# Patient Record
Sex: Female | Born: 1955 | Race: White | Hispanic: No | Marital: Married | Smoking: Former smoker
Health system: Southern US, Community
[De-identification: ages and names within clinical notes are randomized; demographics above are authoritative.]

## PROBLEM LIST (undated history)

## (undated) DIAGNOSIS — I10 Essential (primary) hypertension: Secondary | ICD-10-CM

## (undated) DIAGNOSIS — E7800 Pure hypercholesterolemia, unspecified: Secondary | ICD-10-CM

## (undated) DIAGNOSIS — F32A Depression, unspecified: Secondary | ICD-10-CM

## (undated) DIAGNOSIS — E119 Type 2 diabetes mellitus without complications: Secondary | ICD-10-CM

---

## 2017-02-13 ENCOUNTER — Other Ambulatory Visit (HOSPITAL_COMMUNITY)
Admission: RE | Admit: 2017-02-13 | Discharge: 2017-02-13 | Disposition: A | Discharge status: Home / Self Care | Payer: Self-pay | Source: Ambulatory Visit | Attending: Family Medicine | Admitting: Family Medicine

## 2017-02-13 ENCOUNTER — Other Ambulatory Visit: Payer: Self-pay | Admitting: Family Medicine

## 2017-02-13 DIAGNOSIS — Z124 Encounter for screening for malignant neoplasm of cervix: Secondary | ICD-10-CM | POA: Insufficient documentation

## 2017-02-15 LAB — CYTOLOGY - PAP: Diagnosis: NEGATIVE

## 2017-04-12 ENCOUNTER — Other Ambulatory Visit: Payer: Self-pay | Admitting: Family Medicine

## 2017-04-12 DIAGNOSIS — Z1231 Encounter for screening mammogram for malignant neoplasm of breast: Secondary | ICD-10-CM

## 2017-04-30 ENCOUNTER — Ambulatory Visit
Admission: RE | Admit: 2017-04-30 | Discharge: 2017-04-30 | Disposition: A | Discharge status: Home / Self Care | Payer: BLUE CROSS/BLUE SHIELD | Source: Ambulatory Visit | Attending: Family Medicine | Admitting: Family Medicine

## 2017-04-30 ENCOUNTER — Encounter (INDEPENDENT_AMBULATORY_CARE_PROVIDER_SITE_OTHER): Payer: Self-pay

## 2017-04-30 DIAGNOSIS — Z1231 Encounter for screening mammogram for malignant neoplasm of breast: Secondary | ICD-10-CM

## 2018-06-05 ENCOUNTER — Other Ambulatory Visit: Payer: Self-pay | Admitting: Family Medicine

## 2018-06-05 DIAGNOSIS — Z1231 Encounter for screening mammogram for malignant neoplasm of breast: Secondary | ICD-10-CM

## 2018-06-26 ENCOUNTER — Ambulatory Visit
Admission: RE | Admit: 2018-06-26 | Discharge: 2018-06-26 | Disposition: A | Discharge status: Home / Self Care | Payer: BLUE CROSS/BLUE SHIELD | Source: Ambulatory Visit | Attending: Family Medicine | Admitting: Family Medicine

## 2018-06-26 DIAGNOSIS — Z1231 Encounter for screening mammogram for malignant neoplasm of breast: Secondary | ICD-10-CM

## 2019-05-28 ENCOUNTER — Other Ambulatory Visit: Payer: Self-pay | Admitting: Family Medicine

## 2019-05-28 DIAGNOSIS — Z1231 Encounter for screening mammogram for malignant neoplasm of breast: Secondary | ICD-10-CM

## 2019-07-09 ENCOUNTER — Other Ambulatory Visit: Payer: Self-pay

## 2019-07-09 ENCOUNTER — Ambulatory Visit
Admission: RE | Admit: 2019-07-09 | Discharge: 2019-07-09 | Disposition: A | Discharge status: Home / Self Care | Payer: Medicare Other | Source: Ambulatory Visit | Attending: Family Medicine | Admitting: Family Medicine

## 2019-07-09 DIAGNOSIS — Z1231 Encounter for screening mammogram for malignant neoplasm of breast: Secondary | ICD-10-CM

## 2019-11-10 ENCOUNTER — Ambulatory Visit: Payer: Medicare Other | Attending: Internal Medicine

## 2019-11-10 ENCOUNTER — Other Ambulatory Visit: Payer: Self-pay

## 2019-11-10 DIAGNOSIS — Z20822 Contact with and (suspected) exposure to covid-19: Secondary | ICD-10-CM

## 2019-11-11 LAB — NOVEL CORONAVIRUS, NAA: SARS-CoV-2, NAA: NOT DETECTED

## 2019-12-25 ENCOUNTER — Ambulatory Visit: Payer: Medicare Other

## 2020-01-02 ENCOUNTER — Ambulatory Visit: Payer: Medicare Other | Attending: Internal Medicine

## 2020-01-02 DIAGNOSIS — Z23 Encounter for immunization: Secondary | ICD-10-CM | POA: Insufficient documentation

## 2020-01-02 NOTE — Progress Notes (Signed)
   Covid-19 Vaccination Clinic  Name:  Vanessa Moore    MRN: [id number] DOB: 23-Feb-1956  01/02/2020  [name] was observed post Covid-19 immunization for 30 minutes based on pre-vaccination screening without incidence. She was provided with Vaccine Information Sheet and instruction to access the V-Safe system.   [name] was instructed to call 911 with any severe reactions post vaccine: Marland Kitchen Difficulty breathing  . Swelling of your face and throat  . A fast heartbeat  . A bad rash all over your body  . Dizziness and weakness    Immunizations Administered    Name Date Dose VIS Date Route   Pfizer COVID-19 Vaccine 01/02/2020  1:15 PM 0.3 mL 11/06/2019 Intramuscular   Manufacturer: [employer], [employer]   Lot: [id number]   NDC: [address]-2

## 2020-01-15 ENCOUNTER — Ambulatory Visit: Payer: Medicare Other

## 2020-01-27 ENCOUNTER — Ambulatory Visit: Payer: Medicare Other | Attending: Internal Medicine

## 2020-01-27 DIAGNOSIS — Z23 Encounter for immunization: Secondary | ICD-10-CM

## 2020-01-27 NOTE — Progress Notes (Signed)
   Covid-19 Vaccination Clinic  Name:  ZISSEL BIEDERMAN    MRN: [id number] DOB: September 25, 1956  01/27/2020  Ms. Vancamp was observed post Covid-19 immunization for 15 minutes without incident. She was provided with Vaccine Information Sheet and instruction to access the V-Safe system.   Ms. Aplin was instructed to call 911 with any severe reactions post vaccine: Marland Kitchen Difficulty breathing  . Swelling of face and throat  . A fast heartbeat  . A bad rash all over body  . Dizziness and weakness   Immunizations Administered    Name Date Dose VIS Date Route   Pfizer COVID-19 Vaccine 01/27/2020 11:02 AM 0.3 mL 11/06/2019 Intramuscular   Manufacturer: [employer], [employer]   Lot: [id number]   NDC: [address]-4

## 2020-05-30 ENCOUNTER — Ambulatory Visit: Payer: Medicare Other | Attending: Internal Medicine

## 2020-05-30 DIAGNOSIS — Z20822 Contact with and (suspected) exposure to covid-19: Secondary | ICD-10-CM

## 2020-05-31 LAB — NOVEL CORONAVIRUS, NAA: SARS-CoV-2, NAA: NOT DETECTED

## 2020-05-31 LAB — SARS-COV-2, NAA 2 DAY TAT

## 2020-09-22 ENCOUNTER — Other Ambulatory Visit: Payer: Self-pay | Admitting: Family Medicine

## 2020-09-22 DIAGNOSIS — E2839 Other primary ovarian failure: Secondary | ICD-10-CM

## 2020-09-22 DIAGNOSIS — Z1231 Encounter for screening mammogram for malignant neoplasm of breast: Secondary | ICD-10-CM

## 2021-01-03 ENCOUNTER — Ambulatory Visit: Payer: Medicare Other

## 2021-01-03 ENCOUNTER — Other Ambulatory Visit: Payer: Medicare Other

## 2021-02-13 ENCOUNTER — Inpatient Hospital Stay: Admission: RE | Admit: 2021-02-13 | Payer: Medicare Other | Source: Ambulatory Visit

## 2021-04-05 ENCOUNTER — Ambulatory Visit
Admission: RE | Admit: 2021-04-05 | Discharge: 2021-04-05 | Disposition: A | Discharge status: Home / Self Care | Payer: Medicare Other | Source: Ambulatory Visit | Attending: Family Medicine | Admitting: Family Medicine

## 2021-04-05 ENCOUNTER — Other Ambulatory Visit: Payer: Self-pay

## 2021-04-05 DIAGNOSIS — Z1231 Encounter for screening mammogram for malignant neoplasm of breast: Secondary | ICD-10-CM

## 2021-05-22 ENCOUNTER — Ambulatory Visit
Admission: RE | Admit: 2021-05-22 | Discharge: 2021-05-22 | Disposition: A | Discharge status: Home / Self Care | Payer: Medicare Other | Source: Ambulatory Visit | Attending: Family Medicine | Admitting: Family Medicine

## 2021-05-22 ENCOUNTER — Other Ambulatory Visit: Payer: Self-pay

## 2021-05-22 DIAGNOSIS — E2839 Other primary ovarian failure: Secondary | ICD-10-CM

## 2021-10-03 DIAGNOSIS — E1169 Type 2 diabetes mellitus with other specified complication: Secondary | ICD-10-CM | POA: Diagnosis not present

## 2022-10-29 ENCOUNTER — Other Ambulatory Visit: Payer: Self-pay | Admitting: Internal Medicine

## 2022-10-29 ENCOUNTER — Ambulatory Visit
Admission: RE | Admit: 2022-10-29 | Discharge: 2022-10-29 | Disposition: A | Discharge status: Home / Self Care | Payer: Medicare Other | Source: Ambulatory Visit | Attending: Internal Medicine | Admitting: Internal Medicine

## 2022-10-29 DIAGNOSIS — Z1231 Encounter for screening mammogram for malignant neoplasm of breast: Secondary | ICD-10-CM

## 2022-11-23 IMAGING — MG MM DIGITAL SCREENING BILAT W/ TOMO AND CAD
8 series · 9 of 24 positions shown · non-contrast
Comparison: Previous exam(s).

CLINICAL DATA: Screening.

EXAM:
DIGITAL SCREENING BILATERAL MAMMOGRAM WITH TOMOSYNTHESIS AND CAD
TECHNIQUE: Bilateral screening digital craniocaudal and mediolateral oblique
mammograms were obtained. Bilateral screening digital breast
tomosynthesis was performed. The images were evaluated with
computer-aided detection.

[L MLO synth-2D]
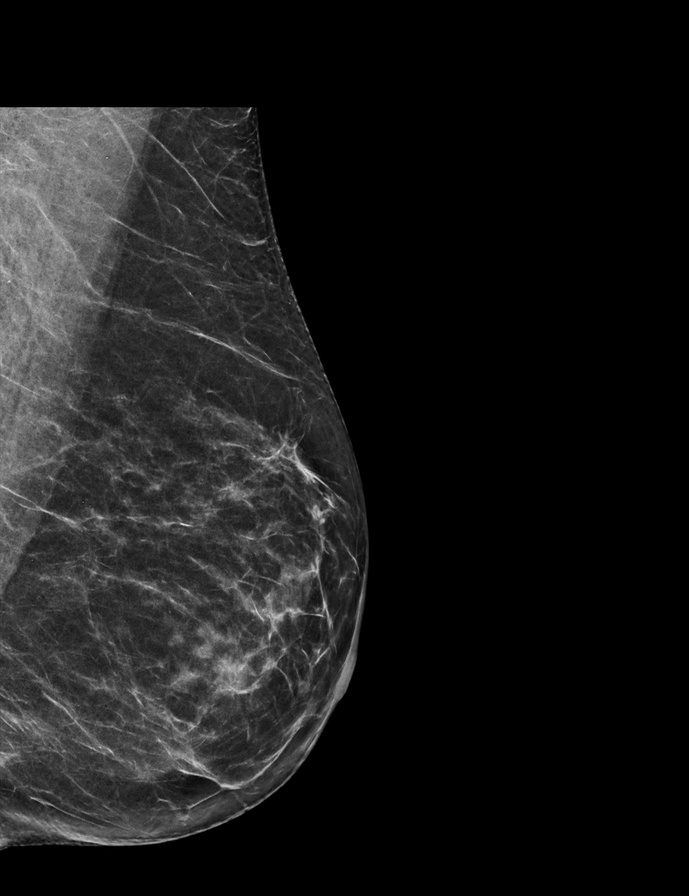

[R CC synth-2D]
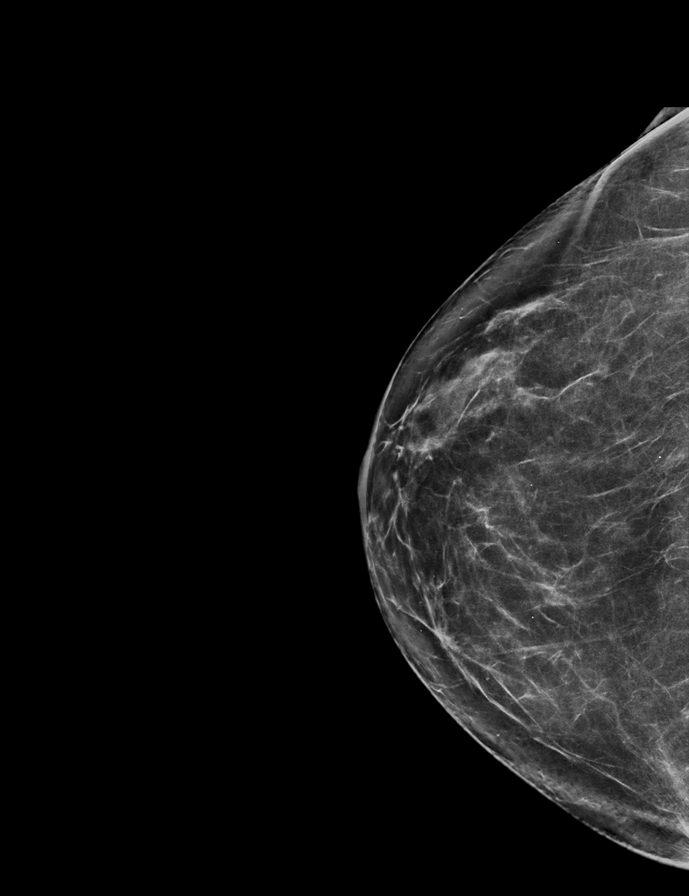

[L CC synth-2D]
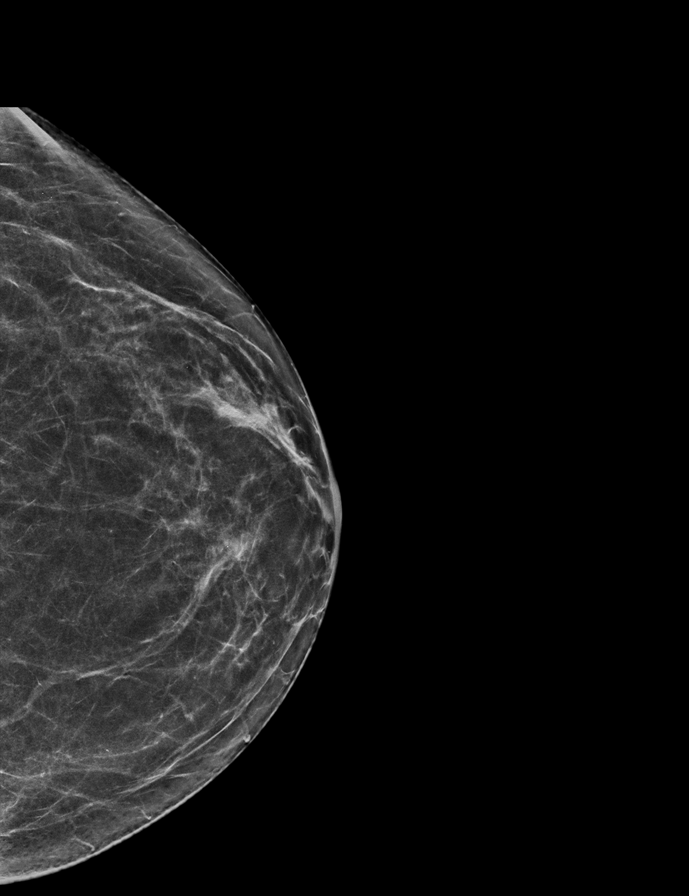

[R MLO synth-2D]
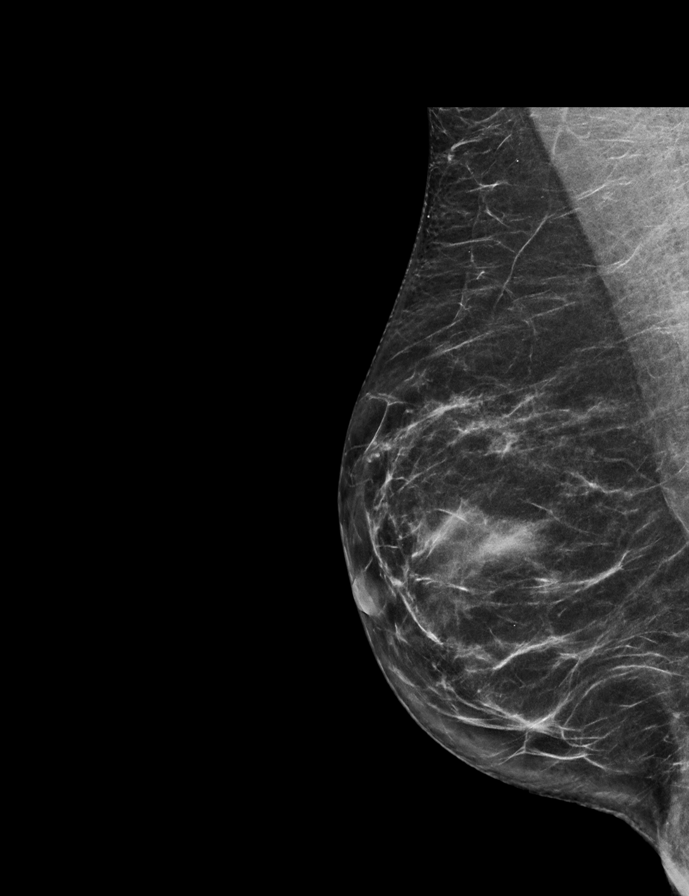

[R CC tomo · 2 of 69 frames shown]
[frame 23/69]
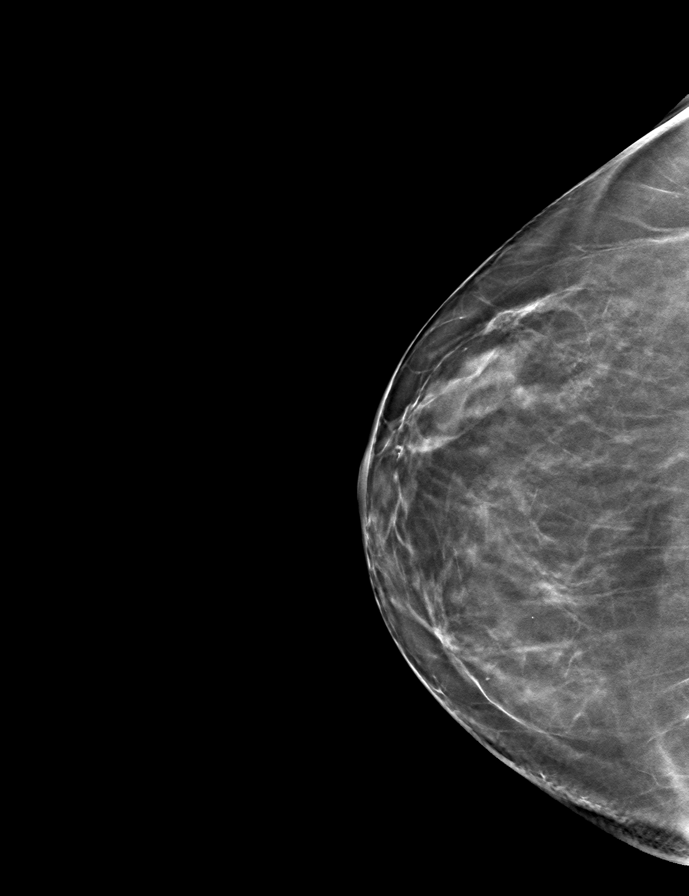
[frame 35/69]
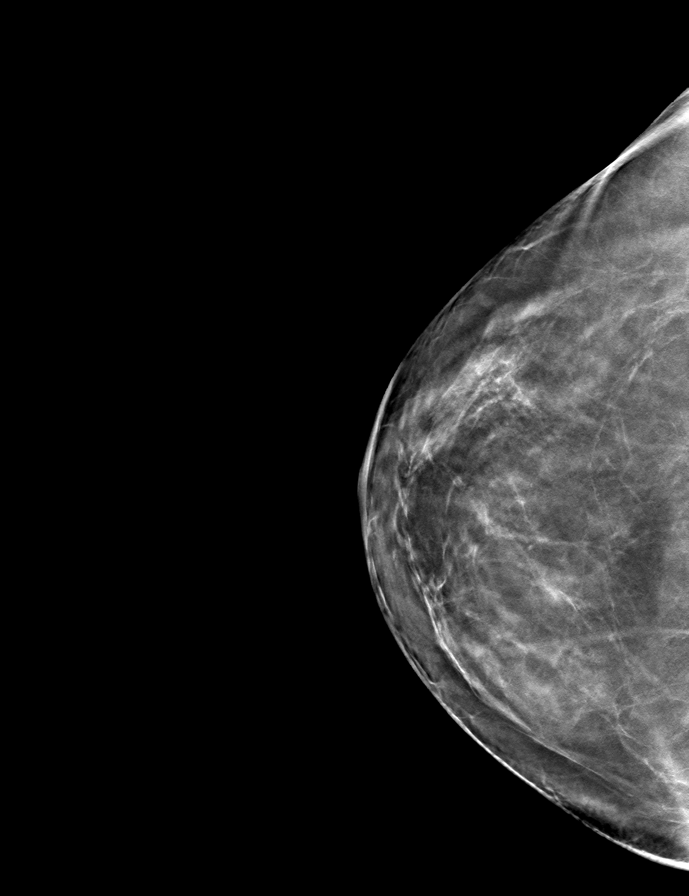

[R MLO tomo · tomo slice 31/61.0]
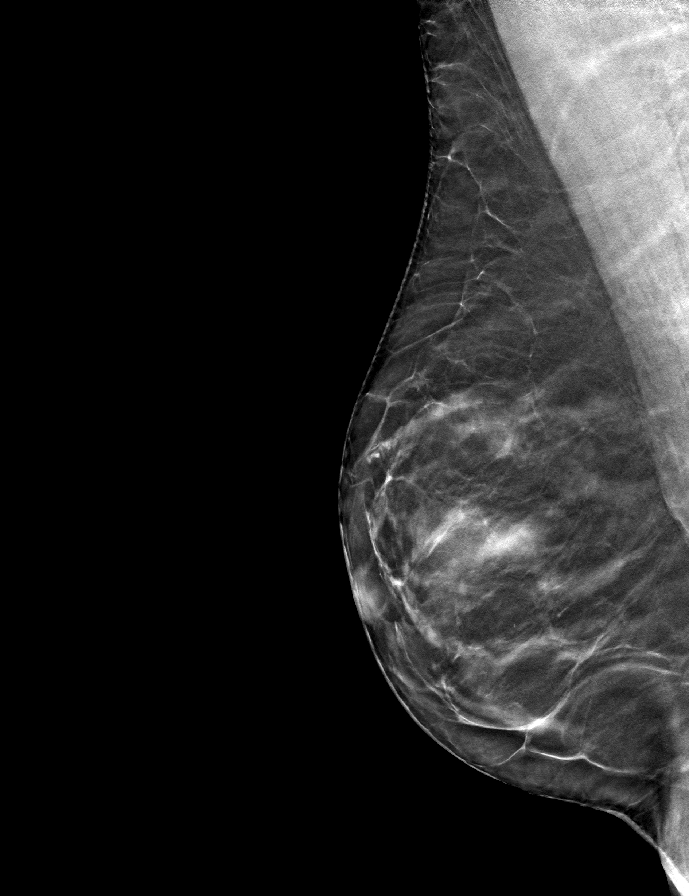

[L CC tomo · tomo slice 29/58.0]
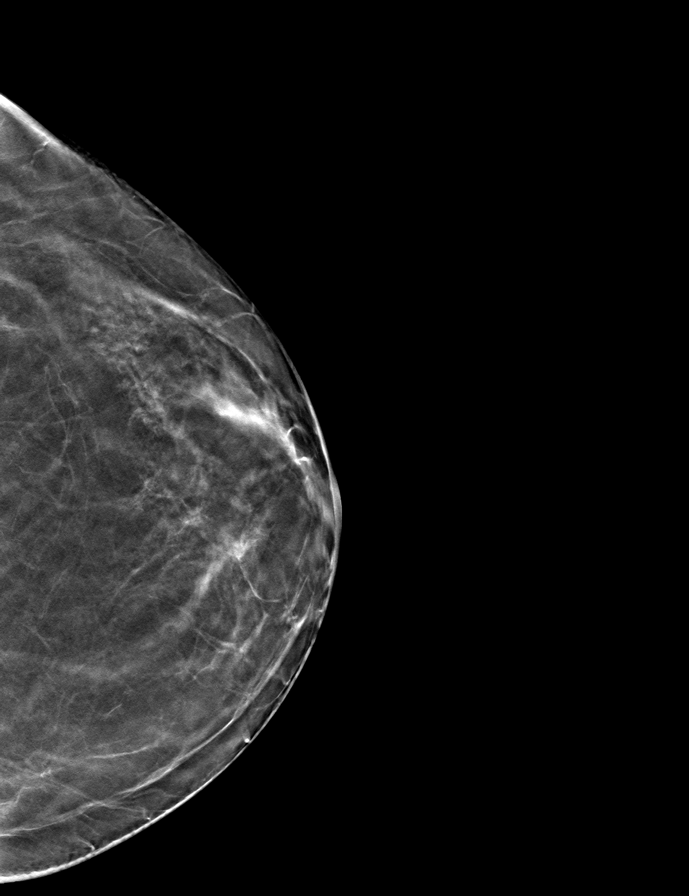

[L MLO tomo · tomo slice 31/60.0]
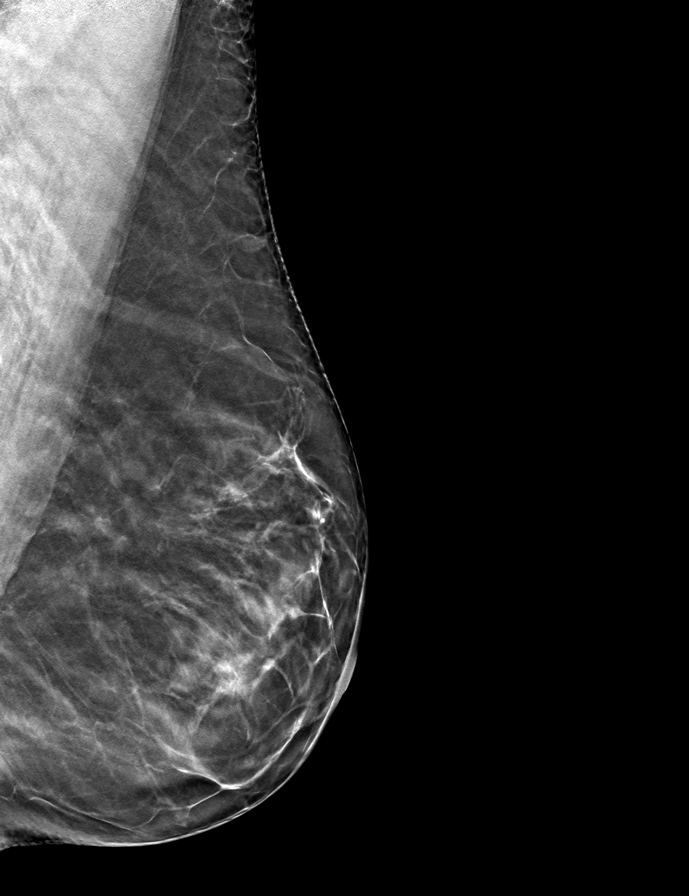

[9 of 24 positions shown; findings below may reference images not displayed]

ACR Breast Density Category b: There are scattered areas of
fibroglandular density.
FINDINGS: There are no findings suspicious for malignancy. The images were
evaluated with computer-aided detection.
IMPRESSION: No mammographic evidence of malignancy. A result letter of this
screening mammogram will be mailed directly to the patient.

RECOMMENDATION:
Screening mammogram in one year. (Code:WJ-[id number])

BI-RADS CATEGORY  1: Negative.

## 2023-05-01 DIAGNOSIS — F39 Unspecified mood [affective] disorder: Secondary | ICD-10-CM | POA: Diagnosis not present

## 2023-05-01 DIAGNOSIS — E7800 Pure hypercholesterolemia, unspecified: Secondary | ICD-10-CM | POA: Diagnosis not present

## 2023-05-01 DIAGNOSIS — L719 Rosacea, unspecified: Secondary | ICD-10-CM | POA: Diagnosis not present

## 2023-05-01 DIAGNOSIS — E119 Type 2 diabetes mellitus without complications: Secondary | ICD-10-CM | POA: Diagnosis not present

## 2023-05-01 DIAGNOSIS — J309 Allergic rhinitis, unspecified: Secondary | ICD-10-CM | POA: Diagnosis not present

## 2023-05-01 DIAGNOSIS — I10 Essential (primary) hypertension: Secondary | ICD-10-CM | POA: Diagnosis not present

## 2023-07-09 DIAGNOSIS — H40013 Open angle with borderline findings, low risk, bilateral: Secondary | ICD-10-CM | POA: Diagnosis not present

## 2023-07-09 DIAGNOSIS — H2513 Age-related nuclear cataract, bilateral: Secondary | ICD-10-CM | POA: Diagnosis not present

## 2023-07-09 DIAGNOSIS — H5213 Myopia, bilateral: Secondary | ICD-10-CM | POA: Diagnosis not present

## 2023-07-09 DIAGNOSIS — E119 Type 2 diabetes mellitus without complications: Secondary | ICD-10-CM | POA: Diagnosis not present

## 2023-09-11 DIAGNOSIS — L578 Other skin changes due to chronic exposure to nonionizing radiation: Secondary | ICD-10-CM | POA: Diagnosis not present

## 2023-09-11 DIAGNOSIS — L821 Other seborrheic keratosis: Secondary | ICD-10-CM | POA: Diagnosis not present

## 2023-09-11 DIAGNOSIS — L570 Actinic keratosis: Secondary | ICD-10-CM | POA: Diagnosis not present

## 2023-09-11 DIAGNOSIS — D229 Melanocytic nevi, unspecified: Secondary | ICD-10-CM | POA: Diagnosis not present

## 2023-09-11 DIAGNOSIS — L814 Other melanin hyperpigmentation: Secondary | ICD-10-CM | POA: Diagnosis not present

## 2023-10-02 ENCOUNTER — Other Ambulatory Visit: Payer: Self-pay | Admitting: Internal Medicine

## 2023-10-02 DIAGNOSIS — Z1231 Encounter for screening mammogram for malignant neoplasm of breast: Secondary | ICD-10-CM

## 2023-10-07 ENCOUNTER — Ambulatory Visit: Payer: Medicare Other

## 2023-10-30 ENCOUNTER — Ambulatory Visit: Payer: PPO

## 2023-11-13 ENCOUNTER — Encounter (HOSPITAL_COMMUNITY): Payer: Self-pay

## 2023-11-13 ENCOUNTER — Emergency Department (HOSPITAL_COMMUNITY)
Admission: EM | Admit: 2023-11-13 | Discharge: 2023-11-14 | Discharge status: Against Medical Advice | Payer: PPO | Attending: Emergency Medicine | Admitting: Emergency Medicine

## 2023-11-13 ENCOUNTER — Other Ambulatory Visit: Payer: Self-pay

## 2023-11-13 DIAGNOSIS — R319 Hematuria, unspecified: Secondary | ICD-10-CM | POA: Diagnosis present

## 2023-11-13 DIAGNOSIS — Z5329 Procedure and treatment not carried out because of patient's decision for other reasons: Secondary | ICD-10-CM | POA: Diagnosis not present

## 2023-11-13 DIAGNOSIS — N3001 Acute cystitis with hematuria: Secondary | ICD-10-CM | POA: Diagnosis not present

## 2023-11-13 DIAGNOSIS — E119 Type 2 diabetes mellitus without complications: Secondary | ICD-10-CM | POA: Diagnosis not present

## 2023-11-13 DIAGNOSIS — I10 Essential (primary) hypertension: Secondary | ICD-10-CM | POA: Diagnosis not present

## 2023-11-13 HISTORY — DX: Depression, unspecified: F32.A

## 2023-11-13 HISTORY — DX: Essential (primary) hypertension: I10

## 2023-11-13 HISTORY — DX: Type 2 diabetes mellitus without complications: E11.9

## 2023-11-13 HISTORY — DX: Pure hypercholesterolemia, unspecified: E78.00

## 2023-11-13 LAB — URINALYSIS, ROUTINE W REFLEX MICROSCOPIC
Bilirubin Urine: NEGATIVE
Glucose, UA: NEGATIVE mg/dL
Ketones, ur: NEGATIVE mg/dL
Nitrite: NEGATIVE
Protein, ur: 30 mg/dL — AB
Specific Gravity, Urine: 1.002 — ABNORMAL LOW (ref 1.005–1.030)
pH: 7 (ref 5.0–8.0)

## 2023-11-13 NOTE — ED Triage Notes (Signed)
Pt presents with dysuria, frequency, and hematuria that started today. Pt reports some fatigue, denies fever.

## 2023-11-14 ENCOUNTER — Other Ambulatory Visit: Payer: Self-pay

## 2023-11-14 ENCOUNTER — Emergency Department (HOSPITAL_BASED_OUTPATIENT_CLINIC_OR_DEPARTMENT_OTHER)
Admission: EM | Admit: 2023-11-14 | Discharge: 2023-11-14 | Disposition: A | Discharge status: Home / Self Care | Payer: PPO | Source: Home / Self Care | Attending: Emergency Medicine | Admitting: Emergency Medicine

## 2023-11-14 DIAGNOSIS — R1030 Lower abdominal pain, unspecified: Secondary | ICD-10-CM | POA: Insufficient documentation

## 2023-11-14 DIAGNOSIS — Z79899 Other long term (current) drug therapy: Secondary | ICD-10-CM | POA: Insufficient documentation

## 2023-11-14 DIAGNOSIS — N3001 Acute cystitis with hematuria: Secondary | ICD-10-CM | POA: Insufficient documentation

## 2023-11-14 DIAGNOSIS — I10 Essential (primary) hypertension: Secondary | ICD-10-CM | POA: Insufficient documentation

## 2023-11-14 DIAGNOSIS — E119 Type 2 diabetes mellitus without complications: Secondary | ICD-10-CM | POA: Insufficient documentation

## 2023-11-14 MED ORDER — PHENAZOPYRIDINE HCL 100 MG PO TABS
200.0000 mg | ORAL_TABLET | Freq: Once | ORAL | Status: AC
  Administered 2023-11-14: 200 mg via ORAL
  Filled 2023-11-14: qty 2

## 2023-11-14 MED ORDER — CEPHALEXIN 250 MG PO CAPS
500.0000 mg | ORAL_CAPSULE | Freq: Once | ORAL | Status: AC
  Administered 2023-11-14: 500 mg via ORAL
  Filled 2023-11-14: qty 2

## 2023-11-14 MED ORDER — CEPHALEXIN 500 MG PO CAPS: 500.0000 mg | ORAL_CAPSULE | Freq: Three times a day (TID) | ORAL | 0 refills | Status: AC

## 2023-11-14 MED ORDER — PHENAZOPYRIDINE HCL 200 MG PO TABS: 200.0000 mg | ORAL_TABLET | Freq: Three times a day (TID) | ORAL | 0 refills | Status: AC

## 2023-11-14 NOTE — ED Provider Notes (Signed)
[place] EMERGENCY DEPARTMENT AT [facility] Provider Note   CSN: [id number] Arrival date & time: 11/14/23  0631     History  Chief Complaint  Patient presents with   Hematuria    Vanessa Moore is a 67 y.o. female.  Pt is a 67 yo female with pmhx significant for htn, dm, hld, and depression.  Pt said she developed some dysuria and urinary frequency yesterday.  She then saw blood in her urine.  She went to [facility] last night for the same, but left prior to being seen due to the wait.  Pt denies fevers.  She does not have any back pain.       Home Medications Prior to Admission medications   Medication Sig Start Date End Date Taking? Authorizing Provider  cephALEXin (KEFLEX) 500 MG capsule Take 1 capsule (500 mg total) by mouth 3 (three) times daily. 11/14/23  Yes Jacalyn Lefevre, MD  phenazopyridine (PYRIDIUM) 200 MG tablet Take 1 tablet (200 mg total) by mouth 3 (three) times daily. 11/14/23  Yes Jacalyn Lefevre, MD      Allergies    Fluoxetine, Other, and Statins    Review of Systems   Review of Systems  Genitourinary:  Positive for dysuria and frequency.  All other systems reviewed and are negative.   Physical Exam Updated Vital Signs BP (!) 156/78 (BP Location: Left Arm)   Pulse 70   Temp 97.7 F (36.5 C) (Oral)   Resp 18   SpO2 100%  Physical Exam Vitals and nursing note reviewed.  Constitutional:      Appearance: Normal appearance.  HENT:     Head: Normocephalic and atraumatic.     Right Ear: External ear normal.     Left Ear: External ear normal.     Nose: Nose normal.     Mouth/Throat:     Mouth: Mucous membranes are moist.     Pharynx: Oropharynx is clear.  Eyes:     Extraocular Movements: Extraocular movements intact.     Conjunctiva/sclera: Conjunctivae normal.     Pupils: Pupils are equal, round, and reactive to light.  Cardiovascular:     Rate and Rhythm: Normal rate and regular rhythm.     Pulses: Normal pulses.     Heart  sounds: Normal heart sounds.  Pulmonary:     Effort: Pulmonary effort is normal.     Breath sounds: Normal breath sounds.  Abdominal:     General: Abdomen is flat. Bowel sounds are normal.     Palpations: Abdomen is soft.     Tenderness: There is abdominal tenderness in the suprapubic area.  Musculoskeletal:        General: Normal range of motion.     Cervical back: Normal range of motion and neck supple.  Skin:    General: Skin is warm.     Capillary Refill: Capillary refill takes less than 2 seconds.  Neurological:     General: No focal deficit present.     Mental Status: She is alert and oriented to person, place, and time.  Psychiatric:        Mood and Affect: Mood normal.        Behavior: Behavior normal.     ED Results / Procedures / Treatments   Labs (all labs ordered are listed, but only abnormal results are displayed) Labs Reviewed  URINALYSIS, ROUTINE W REFLEX MICROSCOPIC    EKG None  Radiology No results found.  Procedures Procedures    Medications Ordered  in ED Medications  cephALEXin (KEFLEX) capsule 500 mg (has no administration in time range)  phenazopyridine (PYRIDIUM) tablet 200 mg (has no administration in time range)    ED Course/ Medical Decision Making/ A&P                                 Medical Decision Making Risk Prescription drug management.   This patient presents to the ED for concern of dysuria, this involves an extensive number of treatment options, and is a complaint that carries with it a high risk of complications and morbidity.  The differential diagnosis includes uti, kidney stone   Co morbidities that complicate the patient evaluation   htn, dm, hld, and depression   Additional history obtained:  Additional history obtained from epic chart review External records from outside source obtained and reviewed including husband   Lab Tests:  I Ordered, and personally interpreted labs.  The pertinent results include:   UA from last night + uti   Medicines ordered and prescription drug management:  I ordered medication including keflex and pyridum  for sx  Reevaluation of the patient after these medicines showed that the patient improved I have reviewed the patients home medicines and have made adjustments as needed   Problem List / ED Course:  UTI:  pt has sx and ua concerning for infection.  Pt has no fevers or back pain c/w stone or pyelo.  She is stable for d/c.  Return if worse.     Reevaluation:  After the interventions noted above, I reevaluated the patient and found that they have :improved   Social Determinants of Health:  Lives at home   Dispostion:  After consideration of the diagnostic results and the patients response to treatment, I feel that the patent would benefit from discharge with outpatient f/u.          Final Clinical Impression(s) / ED Diagnoses Final diagnoses:  Acute cystitis with hematuria    Rx / DC Orders ED Discharge Orders          Ordered    cephALEXin (KEFLEX) 500 MG capsule  3 times daily        11/14/23 0718    phenazopyridine (PYRIDIUM) 200 MG tablet  3 times daily        11/14/23 1610              Jacalyn Lefevre, MD 11/14/23 0725

## 2023-11-14 NOTE — ED Notes (Signed)
Dc instructions reviewed with patient. Patient voiced understanding. Dc with belongings.  °

## 2023-11-14 NOTE — ED Triage Notes (Signed)
Patient presents with hematuria, dysuria and urinary frequency since yesterday morning. Denies abdominal/flank pain. Denies n/v.    Reports she went to [facility] ED last night for same, however LWBS d/t long wait time.

## 2023-11-25 ENCOUNTER — Other Ambulatory Visit: Payer: Self-pay | Admitting: Internal Medicine

## 2023-11-25 DIAGNOSIS — I10 Essential (primary) hypertension: Secondary | ICD-10-CM | POA: Diagnosis not present

## 2023-11-25 DIAGNOSIS — Z13820 Encounter for screening for osteoporosis: Secondary | ICD-10-CM | POA: Diagnosis not present

## 2023-11-25 DIAGNOSIS — E7800 Pure hypercholesterolemia, unspecified: Secondary | ICD-10-CM | POA: Diagnosis not present

## 2023-11-25 DIAGNOSIS — F39 Unspecified mood [affective] disorder: Secondary | ICD-10-CM | POA: Diagnosis not present

## 2023-11-25 DIAGNOSIS — L719 Rosacea, unspecified: Secondary | ICD-10-CM | POA: Diagnosis not present

## 2023-11-25 DIAGNOSIS — E1169 Type 2 diabetes mellitus with other specified complication: Secondary | ICD-10-CM | POA: Diagnosis not present

## 2023-11-25 DIAGNOSIS — G720 Drug-induced myopathy: Secondary | ICD-10-CM | POA: Diagnosis not present

## 2023-11-25 DIAGNOSIS — E2839 Other primary ovarian failure: Secondary | ICD-10-CM

## 2023-11-25 DIAGNOSIS — F5101 Primary insomnia: Secondary | ICD-10-CM | POA: Diagnosis not present

## 2023-11-25 DIAGNOSIS — Z0000 Encounter for general adult medical examination without abnormal findings: Secondary | ICD-10-CM | POA: Diagnosis not present

## 2023-11-25 DIAGNOSIS — J309 Allergic rhinitis, unspecified: Secondary | ICD-10-CM | POA: Diagnosis not present

## 2023-11-30 DIAGNOSIS — N3000 Acute cystitis without hematuria: Secondary | ICD-10-CM | POA: Diagnosis not present

## 2023-12-06 ENCOUNTER — Ambulatory Visit
Admission: RE | Admit: 2023-12-06 | Discharge: 2023-12-06 | Disposition: A | Discharge status: Home / Self Care | Payer: PPO | Source: Ambulatory Visit | Attending: Internal Medicine

## 2023-12-06 DIAGNOSIS — Z1231 Encounter for screening mammogram for malignant neoplasm of breast: Secondary | ICD-10-CM

## 2024-02-24 DIAGNOSIS — E1169 Type 2 diabetes mellitus with other specified complication: Secondary | ICD-10-CM | POA: Diagnosis not present

## 2024-02-24 DIAGNOSIS — E7800 Pure hypercholesterolemia, unspecified: Secondary | ICD-10-CM | POA: Diagnosis not present

## 2024-02-26 DIAGNOSIS — E1169 Type 2 diabetes mellitus with other specified complication: Secondary | ICD-10-CM | POA: Diagnosis not present

## 2024-05-26 DIAGNOSIS — M654 Radial styloid tenosynovitis [de Quervain]: Secondary | ICD-10-CM | POA: Diagnosis not present

## 2024-05-26 DIAGNOSIS — I10 Essential (primary) hypertension: Secondary | ICD-10-CM | POA: Diagnosis not present

## 2024-05-26 DIAGNOSIS — L719 Rosacea, unspecified: Secondary | ICD-10-CM | POA: Diagnosis not present

## 2024-05-26 DIAGNOSIS — F5101 Primary insomnia: Secondary | ICD-10-CM | POA: Diagnosis not present

## 2024-05-26 DIAGNOSIS — J309 Allergic rhinitis, unspecified: Secondary | ICD-10-CM | POA: Diagnosis not present

## 2024-05-26 DIAGNOSIS — F39 Unspecified mood [affective] disorder: Secondary | ICD-10-CM | POA: Diagnosis not present

## 2024-05-26 DIAGNOSIS — Z23 Encounter for immunization: Secondary | ICD-10-CM | POA: Diagnosis not present

## 2024-05-26 DIAGNOSIS — E7800 Pure hypercholesterolemia, unspecified: Secondary | ICD-10-CM | POA: Diagnosis not present

## 2024-05-26 DIAGNOSIS — E1169 Type 2 diabetes mellitus with other specified complication: Secondary | ICD-10-CM | POA: Diagnosis not present

## 2024-06-08 DIAGNOSIS — M25532 Pain in left wrist: Secondary | ICD-10-CM | POA: Diagnosis not present

## 2024-06-29 DIAGNOSIS — R399 Unspecified symptoms and signs involving the genitourinary system: Secondary | ICD-10-CM | POA: Diagnosis not present

## 2024-07-10 DIAGNOSIS — H5213 Myopia, bilateral: Secondary | ICD-10-CM | POA: Diagnosis not present

## 2024-07-10 DIAGNOSIS — E119 Type 2 diabetes mellitus without complications: Secondary | ICD-10-CM | POA: Diagnosis not present

## 2024-07-10 DIAGNOSIS — H40013 Open angle with borderline findings, low risk, bilateral: Secondary | ICD-10-CM | POA: Diagnosis not present

## 2024-07-10 DIAGNOSIS — H2513 Age-related nuclear cataract, bilateral: Secondary | ICD-10-CM | POA: Diagnosis not present

## 2024-07-28 ENCOUNTER — Ambulatory Visit (HOSPITAL_BASED_OUTPATIENT_CLINIC_OR_DEPARTMENT_OTHER)
Admission: RE | Admit: 2024-07-28 | Discharge: 2024-07-28 | Disposition: A | Discharge status: Home / Self Care | Source: Ambulatory Visit | Attending: Internal Medicine | Admitting: Internal Medicine

## 2024-07-28 ENCOUNTER — Other Ambulatory Visit: Payer: PPO

## 2024-07-28 DIAGNOSIS — E2839 Other primary ovarian failure: Secondary | ICD-10-CM | POA: Diagnosis not present

## 2024-07-28 DIAGNOSIS — Z780 Asymptomatic menopausal state: Secondary | ICD-10-CM | POA: Diagnosis not present

## 2024-07-28 DIAGNOSIS — M85831 Other specified disorders of bone density and structure, right forearm: Secondary | ICD-10-CM | POA: Diagnosis not present

## 2024-08-11 DIAGNOSIS — M654 Radial styloid tenosynovitis [de Quervain]: Secondary | ICD-10-CM | POA: Diagnosis not present

## 2024-08-11 DIAGNOSIS — Z1211 Encounter for screening for malignant neoplasm of colon: Secondary | ICD-10-CM | POA: Diagnosis not present

## 2024-09-21 DIAGNOSIS — K648 Other hemorrhoids: Secondary | ICD-10-CM | POA: Diagnosis not present

## 2024-09-21 DIAGNOSIS — K5730 Diverticulosis of large intestine without perforation or abscess without bleeding: Secondary | ICD-10-CM | POA: Diagnosis not present

## 2024-09-21 DIAGNOSIS — Z1211 Encounter for screening for malignant neoplasm of colon: Secondary | ICD-10-CM | POA: Diagnosis not present

## 2024-09-21 DIAGNOSIS — D123 Benign neoplasm of transverse colon: Secondary | ICD-10-CM | POA: Diagnosis not present

## 2024-09-23 DIAGNOSIS — D123 Benign neoplasm of transverse colon: Secondary | ICD-10-CM | POA: Diagnosis not present

## 2024-12-02 ENCOUNTER — Other Ambulatory Visit: Payer: Self-pay | Admitting: Internal Medicine

## 2024-12-02 DIAGNOSIS — Z1231 Encounter for screening mammogram for malignant neoplasm of breast: Secondary | ICD-10-CM

## 2024-12-23 ENCOUNTER — Ambulatory Visit
Admission: RE | Admit: 2024-12-23 | Discharge: 2024-12-23 | Disposition: A | Source: Ambulatory Visit | Attending: Internal Medicine

## 2024-12-23 DIAGNOSIS — Z1231 Encounter for screening mammogram for malignant neoplasm of breast: Secondary | ICD-10-CM
